# Patient Record
Sex: Male | Born: 1990 | Race: Black or African American | Hispanic: No | Marital: Single | Smoking: Current some day smoker
Health system: Southern US, Community
[De-identification: ages and names within clinical notes are randomized; demographics above are authoritative.]

---

## 2016-10-26 ENCOUNTER — Encounter (HOSPITAL_COMMUNITY): Payer: Self-pay | Admitting: *Deleted

## 2016-10-26 ENCOUNTER — Emergency Department (HOSPITAL_COMMUNITY)
Admission: EM | Admit: 2016-10-26 | Discharge: 2016-10-26 | Disposition: A | Discharge status: Home / Self Care | Payer: Self-pay | Attending: Emergency Medicine | Admitting: Emergency Medicine

## 2016-10-26 DIAGNOSIS — R112 Nausea with vomiting, unspecified: Secondary | ICD-10-CM | POA: Insufficient documentation

## 2016-10-26 DIAGNOSIS — F17200 Nicotine dependence, unspecified, uncomplicated: Secondary | ICD-10-CM | POA: Insufficient documentation

## 2016-10-26 DIAGNOSIS — R197 Diarrhea, unspecified: Secondary | ICD-10-CM | POA: Insufficient documentation

## 2016-10-26 LAB — URINALYSIS, ROUTINE W REFLEX MICROSCOPIC
BILIRUBIN URINE: NEGATIVE
Bacteria, UA: NONE SEEN
Glucose, UA: NEGATIVE mg/dL
HGB URINE DIPSTICK: NEGATIVE
Ketones, ur: 5 mg/dL — AB
NITRITE: NEGATIVE
PH: 7 (ref 5.0–8.0)
Protein, ur: 30 mg/dL — AB
SPECIFIC GRAVITY, URINE: 1.03 (ref 1.005–1.030)

## 2016-10-26 LAB — CBC
HCT: 44.5 % (ref 39.0–52.0)
Hemoglobin: 14.5 g/dL (ref 13.0–17.0)
MCH: 26.8 pg (ref 26.0–34.0)
MCHC: 32.6 g/dL (ref 30.0–36.0)
MCV: 82.3 fL (ref 78.0–100.0)
PLATELETS: 214 10*3/uL (ref 150–400)
RBC: 5.41 MIL/uL (ref 4.22–5.81)
RDW: 13.7 % (ref 11.5–15.5)
WBC: 6.8 10*3/uL (ref 4.0–10.5)

## 2016-10-26 LAB — COMPREHENSIVE METABOLIC PANEL
ALT: 122 U/L — AB (ref 17–63)
AST: 70 U/L — AB (ref 15–41)
Albumin: 3.8 g/dL (ref 3.5–5.0)
Alkaline Phosphatase: 47 U/L (ref 38–126)
Anion gap: 6 (ref 5–15)
BILIRUBIN TOTAL: 0.8 mg/dL (ref 0.3–1.2)
BUN: 12 mg/dL (ref 6–20)
CALCIUM: 9.1 mg/dL (ref 8.9–10.3)
CO2: 25 mmol/L (ref 22–32)
CREATININE: 0.97 mg/dL (ref 0.61–1.24)
Chloride: 107 mmol/L (ref 101–111)
GFR calc Af Amer: >60 mL/min (ref >60)
Glucose, Bld: 110 mg/dL — ABNORMAL HIGH (ref 65–99)
Potassium: 3.9 mmol/L (ref 3.5–5.1)
Sodium: 138 mmol/L (ref 135–145)
TOTAL PROTEIN: 7 g/dL (ref 6.5–8.1)

## 2016-10-26 LAB — LIPASE, BLOOD: Lipase: 16 U/L (ref 11–51)

## 2016-10-26 MED ORDER — FAMOTIDINE 20 MG PO TABS: 20.0000 mg | ORAL_TABLET | Freq: Two times a day (BID) | ORAL | 0 refills | Status: AC

## 2016-10-26 MED ORDER — ONDANSETRON 4 MG PO TBDP: 4.0000 mg | ORAL_TABLET | ORAL | 0 refills | Status: AC | PRN

## 2016-10-26 NOTE — ED Triage Notes (Signed)
Pt states emesis and some abdominal pain x 1 week.  Denies changes in bowel or bladder habits.

## 2016-10-26 NOTE — ED Provider Notes (Signed)
[facility]EMER[facility]ENCY DEPT Provider Note   CSN: [id number] Arrival date & time: 10/26/16  1208  By signing my name below, I, Linna DarnerRussell Turner, attest that this documentation has been prepared under the direction and in the presence of physician practitioner, Arby BarretteMarcy Rhianne Soman, MD. Electronically Signed: Linna Darnerussell Turner, Scribe. 10/26/2016. 1:13 PM.  History   Chief Complaint Chief Complaint  Patient presents with  . Emesis    The history is provided by the patient. No language interpreter was used.     HPI Comments: Casey Ellison is a 26 y.o. male who presents to the Emergency Department complaining of persistent nausea and vomiting for one week. He reports associated decreased appetite, cramping epigastric pain, occasional diarrhea, a mild cough, nasal congestion, rhinorrhea, and postnasal drip. He states he felt completely normal prior to onset of his nausea and vomiting. No alleviating factors noted. He denies dysuria, difficulty urinating, joint swelling/redness, or any other associated symptoms.  History reviewed. No pertinent past medical history.  There are no active problems to display for this patient.   History reviewed. No pertinent surgical history.     Home Medications    Prior to Admission medications   Medication Sig Start Date End Date Taking? Authorizing Provider  famotidine (PEPCID) 20 MG tablet Take 1 tablet (20 mg total) by mouth 2 (two) times daily. 10/26/16   Arby BarretteMarcy Cayli Escajeda, MD  ondansetron (ZOFRAN ODT) 4 MG disintegrating tablet Take 1 tablet (4 mg total) by mouth every 4 (four) hours as needed for nausea or vomiting. 10/26/16   Arby BarretteMarcy Parlee Amescua, MD    Family History No family history on file.  Social History Social History  Substance Use Topics  . Smoking status: Current Some Day Smoker  . Smokeless tobacco: Never Used  . Alcohol use Yes     Comment: occ     Allergies   Patient has no known allergies.   Review of Systems Review of Systems  10  Systems reviewed and all are negative for acute change except as noted in the HPI.  Physical Exam Updated Vital Signs BP 108/81 (BP Location: Left Arm)   Pulse 76   Temp 97.6 F (36.4 C) (Oral)   Resp 18   Ht 5\' 9"  (1.753 m)   Wt 140 lb (63.5 kg)   SpO2 100%   BMI 20.67 kg/m   Physical Exam  Constitutional: He is oriented to person, place, and time. He appears well-developed and well-nourished. No distress.  HENT:  Head: Normocephalic and atraumatic.  Eyes: Conjunctivae and EOM are normal.  Neck: Neck supple.  Cardiovascular: Normal rate, regular rhythm, normal heart sounds and intact distal pulses.  Exam reveals no gallop and no friction rub.   No murmur heard. Pulmonary/Chest: Effort normal and breath sounds normal. No respiratory distress. He has no wheezes. He has no rales. He exhibits no tenderness.  Abdominal: Soft. Bowel sounds are normal. He exhibits no distension and no mass. There is no tenderness.  Musculoskeletal: Normal range of motion. He exhibits no edema or tenderness.  Lower extremities: No peripheral edema. Calves are soft and non-tender.   Lymphadenopathy:    He has no cervical adenopathy.  Neurological: He is alert and oriented to person, place, and time. No cranial nerve deficit. He exhibits normal muscle tone. Coordination normal.  Skin: Skin is warm and dry.  Psychiatric: He has a normal mood and affect.  Nursing note and vitals reviewed.   ED Treatments / Results  Labs (all labs ordered are listed, but only abnormal  results are displayed) Labs Reviewed  COMPREHENSIVE METABOLIC PANEL - Abnormal; Notable for the following:       Result Value   Glucose, Bld 110 (*)    AST 70 (*)    ALT 122 (*)    All other components within normal limits  URINALYSIS, ROUTINE W REFLEX MICROSCOPIC - Abnormal; Notable for the following:    APPearance HAZY (*)    Ketones, ur 5 (*)    Protein, ur 30 (*)    Leukocytes, UA MODERATE (*)    Squamous Epithelial / LPF 0-5  (*)    All other components within normal limits  LIPASE, BLOOD  CBC    EKG  EKG Interpretation None       Radiology No results found.  Procedures Procedures (including critical care time)  DIAGNOSTIC STUDIES: Oxygen Saturation is 100% on RA, normal by my interpretation.    COORDINATION OF CARE: 1:17 PM Discussed treatment plan with pt at bedside and pt agreed to plan.  Medications Ordered in ED Medications - No data to display   Initial Impression / Assessment and Plan / ED Course  I have reviewed the triage vital signs and the nursing notes.  Pertinent labs & imaging results that were available during my care of the patient were reviewed by me and considered in my medical decision making (see chart for details).       Final Clinical Impressions(s) / ED Diagnoses   Final diagnoses:  Nausea vomiting and diarrhea  patient is clinically well appearance. Diagnostic studies are within normal limits. He is otherwise healthy. At this time findings most suggestive of a viral gastroenteritis. The patient will be treated with Pepcid and Zofran for symptoms. He is counseled on follow-up and return symptoms.  New Prescriptions New Prescriptions   FAMOTIDINE (PEPCID) 20 MG TABLET    Take 1 tablet (20 mg total) by mouth 2 (two) times daily.   ONDANSETRON (ZOFRAN ODT) 4 MG DISINTEGRATING TABLET    Take 1 tablet (4 mg total) by mouth every 4 (four) hours as needed for nausea or vomiting.      Arby Barrette, MD 10/26/16 [phone]

## 2016-10-30 ENCOUNTER — Encounter (HOSPITAL_COMMUNITY): Payer: Self-pay

## 2016-10-30 ENCOUNTER — Emergency Department (HOSPITAL_COMMUNITY)
Admission: EM | Admit: 2016-10-30 | Discharge: 2016-10-30 | Disposition: A | Discharge status: Home / Self Care | Payer: BLUE CROSS/BLUE SHIELD | Attending: Emergency Medicine | Admitting: Emergency Medicine

## 2016-10-30 ENCOUNTER — Emergency Department (HOSPITAL_COMMUNITY): Payer: BLUE CROSS/BLUE SHIELD

## 2016-10-30 DIAGNOSIS — K2960 Other gastritis without bleeding: Secondary | ICD-10-CM | POA: Diagnosis not present

## 2016-10-30 DIAGNOSIS — F17200 Nicotine dependence, unspecified, uncomplicated: Secondary | ICD-10-CM | POA: Diagnosis not present

## 2016-10-30 DIAGNOSIS — R109 Unspecified abdominal pain: Secondary | ICD-10-CM | POA: Diagnosis present

## 2016-10-30 DIAGNOSIS — K2900 Acute gastritis without bleeding: Secondary | ICD-10-CM

## 2016-10-30 LAB — URINALYSIS, ROUTINE W REFLEX MICROSCOPIC
BILIRUBIN URINE: NEGATIVE
GLUCOSE, UA: NEGATIVE mg/dL
HGB URINE DIPSTICK: NEGATIVE
Ketones, ur: 5 mg/dL — AB
Leukocytes, UA: NEGATIVE
Nitrite: NEGATIVE
PH: 5 (ref 5.0–8.0)
Protein, ur: NEGATIVE mg/dL
SPECIFIC GRAVITY, URINE: 1.03 (ref 1.005–1.030)

## 2016-10-30 LAB — COMPREHENSIVE METABOLIC PANEL
ALT: 68 U/L — ABNORMAL HIGH (ref 17–63)
AST: 41 U/L (ref 15–41)
Albumin: 4 g/dL (ref 3.5–5.0)
Alkaline Phosphatase: 43 U/L (ref 38–126)
Anion gap: 8 (ref 5–15)
BUN: 12 mg/dL (ref 6–20)
CO2: 23 mmol/L (ref 22–32)
CREATININE: 0.92 mg/dL (ref 0.61–1.24)
Calcium: 8.9 mg/dL (ref 8.9–10.3)
Chloride: 107 mmol/L (ref 101–111)
GFR calc non Af Amer: >60 mL/min (ref >60)
Glucose, Bld: 88 mg/dL (ref 65–99)
Potassium: 4 mmol/L (ref 3.5–5.1)
SODIUM: 138 mmol/L (ref 135–145)
Total Bilirubin: 0.2 mg/dL — ABNORMAL LOW (ref 0.3–1.2)
Total Protein: 6.6 g/dL (ref 6.5–8.1)

## 2016-10-30 LAB — CBC WITH DIFFERENTIAL/PLATELET
BLASTS: 0 %
Band Neutrophils: 0 %
Basophils Absolute: 0 10*3/uL (ref 0.0–0.1)
Basophils Relative: 0 %
Eosinophils Absolute: 0.4 10*3/uL (ref 0.0–0.7)
Eosinophils Relative: 6 %
HCT: 43 % (ref 39.0–52.0)
HEMOGLOBIN: 14.2 g/dL (ref 13.0–17.0)
Lymphocytes Relative: 33 %
Lymphs Abs: 2.1 10*3/uL (ref 0.7–4.0)
MCH: 26.8 pg (ref 26.0–34.0)
MCHC: 33 g/dL (ref 30.0–36.0)
MCV: 81.1 fL (ref 78.0–100.0)
MONO ABS: 1.3 10*3/uL — AB (ref 0.1–1.0)
MYELOCYTES: 0 %
Metamyelocytes Relative: 0 %
Monocytes Relative: 21 %
NEUTROS PCT: 40 %
NRBC: 0 /100{WBCs}
Neutro Abs: 2.5 10*3/uL (ref 1.7–7.7)
Other: 0 %
PROMYELOCYTES ABS: 0 %
Platelets: 207 10*3/uL (ref 150–400)
RBC: 5.3 MIL/uL (ref 4.22–5.81)
RDW: 13.4 % (ref 11.5–15.5)
WBC: 6.3 10*3/uL (ref 4.0–10.5)

## 2016-10-30 LAB — LIPASE, BLOOD: Lipase: 20 U/L (ref 11–51)

## 2016-10-30 MED ORDER — ONDANSETRON 4 MG PO TBDP
4.0000 mg | ORAL_TABLET | Freq: Once | ORAL | Status: AC
  Administered 2016-10-30: 4 mg via ORAL

## 2016-10-30 MED ORDER — ONDANSETRON 4 MG PO TBDP
ORAL_TABLET | ORAL | Status: AC
  Filled 2016-10-30: qty 1

## 2016-10-30 NOTE — Discharge Instructions (Signed)
Your blood work and ultrasound tonight are normal. Continue the medications you were given at your last visit. Call and make an appointment with the GI doctor. Go back to the bland diet and progress slowly.

## 2016-10-30 NOTE — ED Triage Notes (Signed)
Pt presents with continued generalized abdominal pain, nausea and vomiting.  Pt reports he was seen here for same last week with no relief.

## 2016-10-30 NOTE — ED Provider Notes (Signed)
[facility] Provider Note   By signing my name below, I, Casey Ellison, attest that this documentation has been prepared under the direction and in the presence of [facility], [place]. Electronically Signed: [name], ED Scribe. 10/30/16. 7:53 PM.   History   Chief Complaint Chief Complaint  Patient presents with  . Abdominal Pain   The history is provided by the patient and medical records. No language interpreter was used.   HPI Comments: Casey Ellison is a 26 y.o. male who presents to the Emergency Department complaining of intermittent abdominal pain that began about 1.5 weeks ago. He reports associated nausea and vomiting. Pt was seen here four days ago for the same symptoms, told he had a stomach virus and was prescribed Pepcid and Zofran. He has been taking the Pepcid which provided some relief temporarily for the first few days. Eating increases the symptoms and pt reports vomiting after eating [employer] earlier today. He states this is the first time he has vomited since he was seen four days ago. He states he has eaten chicken and rice, applesauce and pizza without issue in the last few days. He denies alleviating factors. He denies fever, chills, diarrhea, cough, sore throat, dysuria, frequency.   History reviewed. No pertinent past medical history.  There are no active problems to display for this patient.  History reviewed. No pertinent surgical history.  Home Medications    Prior to Admission medications   Medication Sig Start Date End Date Taking? Authorizing Provider  famotidine (PEPCID) 20 MG tablet Take 1 tablet (20 mg total) by mouth 2 (two) times daily. 10/26/16   Arby Barrette, MD  ondansetron (ZOFRAN ODT) 4 MG disintegrating tablet Take 1 tablet (4 mg total) by mouth every 4 (four) hours as needed for nausea or vomiting. 10/26/16   Arby Barrette, MD    Family History History reviewed. No pertinent family history.  Social  History Social History  Substance Use Topics  . Smoking status: Current Some Day Smoker  . Smokeless tobacco: Never Used  . Alcohol use Yes     Comment: occ     Allergies   Patient has no known allergies.   Review of Systems Review of Systems  Constitutional: Negative for chills and fever.  HENT: Negative for sore throat.   Respiratory: Negative for cough.   Gastrointestinal: Positive for abdominal pain, nausea and vomiting. Negative for diarrhea.  Genitourinary: Negative for dysuria and frequency.  Musculoskeletal: Negative for back pain.  Skin: Negative for rash.  Neurological: Negative for headaches.  Psychiatric/Behavioral: Negative for confusion.    Physical Exam Updated Vital Signs BP 113/66 (BP Location: Right Arm)   Pulse 89   Temp 98.1 F (36.7 C) (Oral)   Resp (!) 22   Ht 5\' 11"  (1.803 m)   Wt 140 lb (63.5 kg)   SpO2 99%   BMI 19.53 kg/m   Physical Exam  Constitutional: He appears well-developed and well-nourished.  Eyes: EOM are normal.  Neck: Neck supple.  Cardiovascular: Normal rate and regular rhythm.   Pulmonary/Chest: Effort normal and breath sounds normal.  Abdominal: Soft. Bowel sounds are normal. There is no tenderness. There is no CVA tenderness.  Non tender with deep palpation I am unable to reproduce the pain that the patient had earlier.   Musculoskeletal: Normal range of motion.  Neurological: He is alert.  Skin: Skin is warm and dry.  Psychiatric: He has a normal mood and affect. His behavior is normal.  Nursing note  and vitals reviewed.   ED Treatments / Results  DIAGNOSTIC STUDIES: Oxygen Saturation is 99% on RA, normal by my interpretation.   COORDINATION OF CARE: 5:39 PM- Will speak with Dr. Rush Landmarkegeler about appropriate plan of treatment. Pt verbalizes understanding and agrees to plan.  5:41 PM- Will order ultrasound of gall bladder.   Medications  ondansetron (ZOFRAN-ODT) 4 MG disintegrating tablet (not administered)   ondansetron (ZOFRAN-ODT) disintegrating tablet 4 mg (4 mg Oral Given 10/30/16 1520)   Labs (all labs ordered are listed, but only abnormal results are displayed) Labs Reviewed  CBC WITH DIFFERENTIAL/PLATELET - Abnormal; Notable for the following:       Result Value   Monocytes Absolute 1.3 (*)    All other components within normal limits  COMPREHENSIVE METABOLIC PANEL - Abnormal; Notable for the following:    ALT 68 (*)    Total Bilirubin 0.2 (*)    All other components within normal limits  URINALYSIS, ROUTINE W REFLEX MICROSCOPIC - Abnormal; Notable for the following:    Ketones, ur 5 (*)    All other components within normal limits  LIPASE, BLOOD   Radiology [place] Abdomen Limited  Result Date: 10/30/2016 CLINICAL DATA:  Abdominal pain with nausea and vomiting for 2 weeks. EXAM: US ABDOMEN LIMITED - RIGHT UPPER QUADRANT COMPARISON:  None. FINDINGS: Gallbladder: No gallstones or gallbladder wall thickening. No pericholecystic fluid. The sonographer reports no sonographic Murphy's sign. Common bile duct: Diameter: 2 mm Liver: No focal lesion identified. Within normal limits in parenchymal echogenicity. IMPRESSION: Normal limited right upper quadrant ultrasound. Electronically Signed   By: Kennith CenterEric  Mansell M.D.   On: 10/30/2016 19:21    Procedures Procedures (including critical care time)  Medications Ordered in ED Medications  ondansetron (ZOFRAN-ODT) 4 MG disintegrating tablet (not administered)  ondansetron (ZOFRAN-ODT) disintegrating tablet 4 mg (4 mg Oral Given 10/30/16 1520)     Initial Impression / Assessment and Plan / ED Course  I have reviewed the triage vital signs and the nursing notes.  Pertinent labs & imaging results that were available during my care of the patient were reviewed by me and considered in my medical decision making (see chart for details).  Patient with symptoms consistent with gastritis.  Vitals are stable, no fever.  No signs of dehydration, tolerating  PO fluids > 6 oz.  Lungs are clear.  Ultrasound negative. No focal abdominal pain, no concern for appendicitis, cholecystitis, pancreatitis, ruptured viscus, UTI, kidney stone, or any other abdominal etiology. Supportive therapy indicated with return if symptoms worsen. Patient counseled. Patient will continue his medications from last visit and follow up with GI. Return precautions.   I personally performed the services described in this documentation, which was scribed in my presence. The recorded information has been reviewed and is accurate.   Final Clinical Impressions(s) / ED Diagnoses   Final diagnoses:  Other acute gastritis without hemorrhage    New Prescriptions New Prescriptions   No medications on file      [facility] Neese, NP 10/30/16 [id number]    Canary Brimhristopher J Tegeler, MD 10/31/16 [phone]

## 2018-09-04 IMAGING — US US ABDOMEN LIMITED
1 series · 14 of 25 positions shown · non-contrast
Comparison: None.

CLINICAL DATA: Abdominal pain with nausea and vomiting for 2 weeks.

EXAM:
US ABDOMEN LIMITED - RIGHT UPPER QUADRANT

[Series 1: us abdomen limited · 0.19mm/px · 14 of 46 slices shown]
[im 1/46]
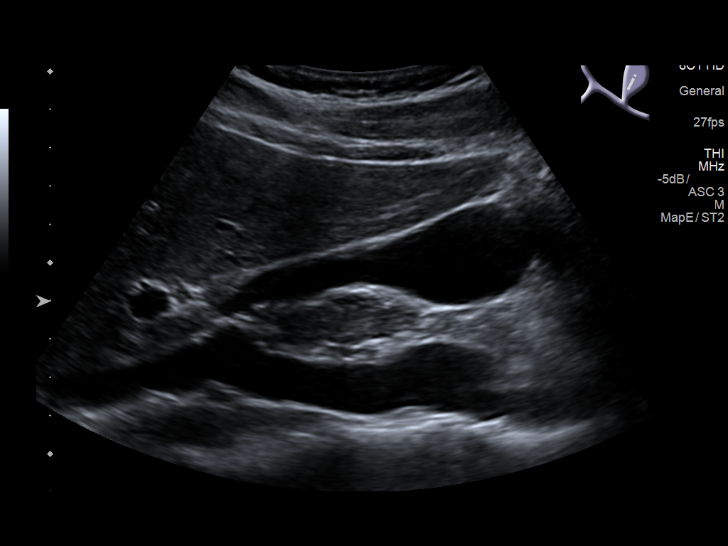
[im 4/46]
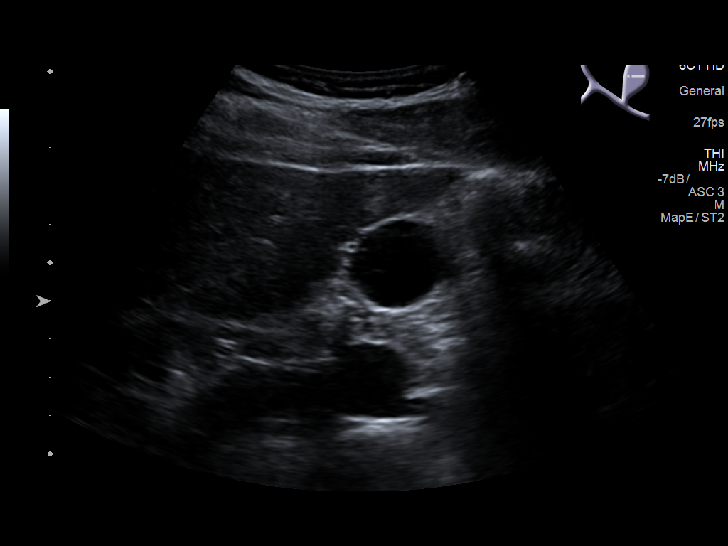
[im 8/46]
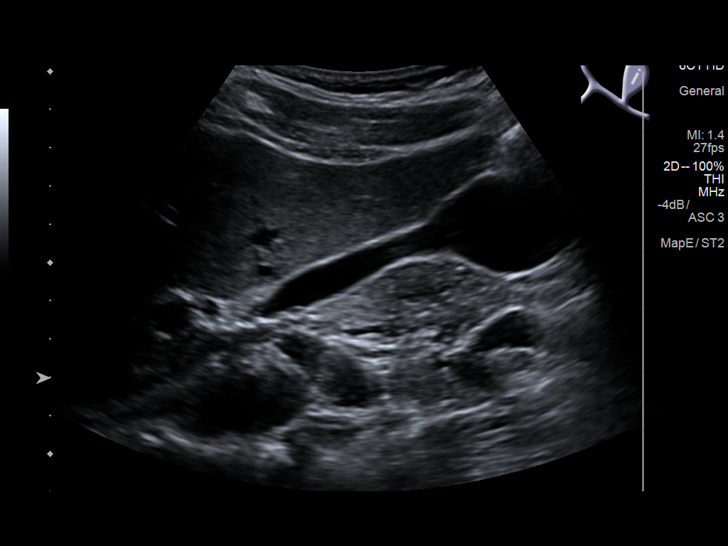
[im 12/46]
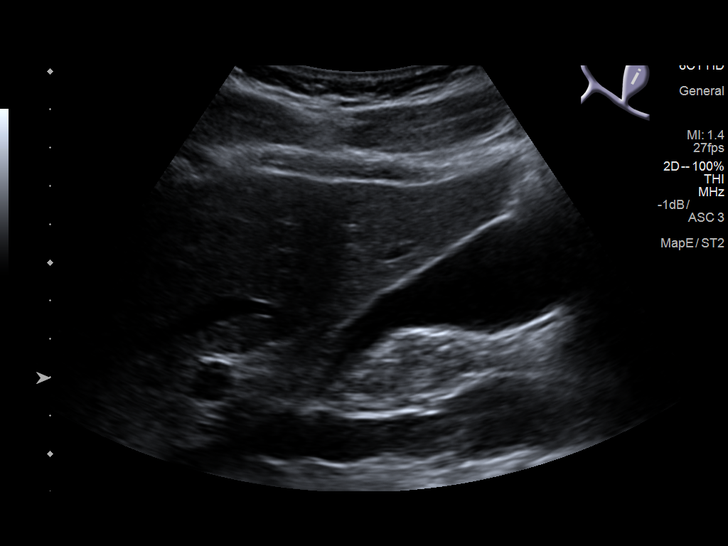
[im 16/46]
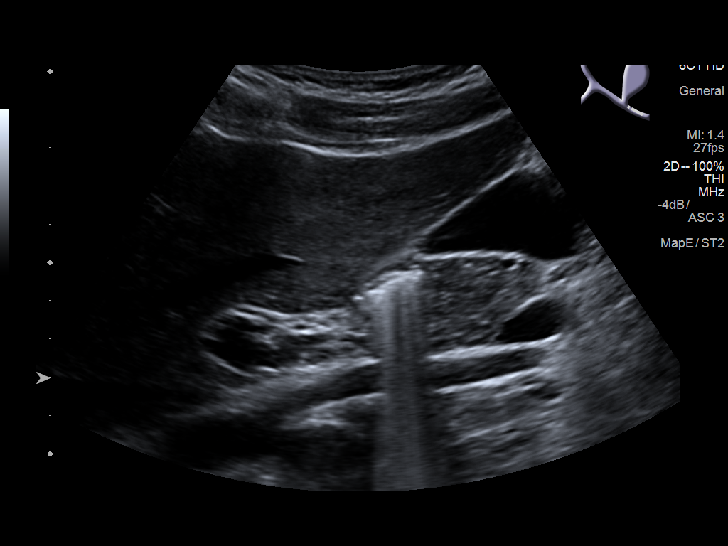
[im 17/46]
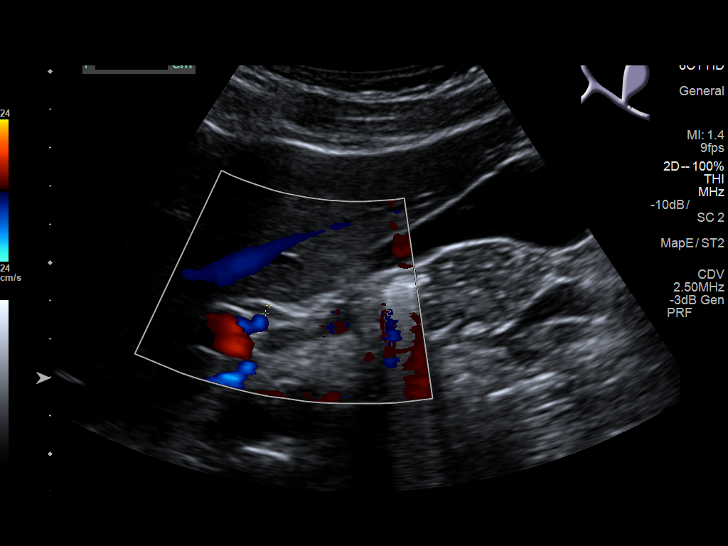
[im 21/46]
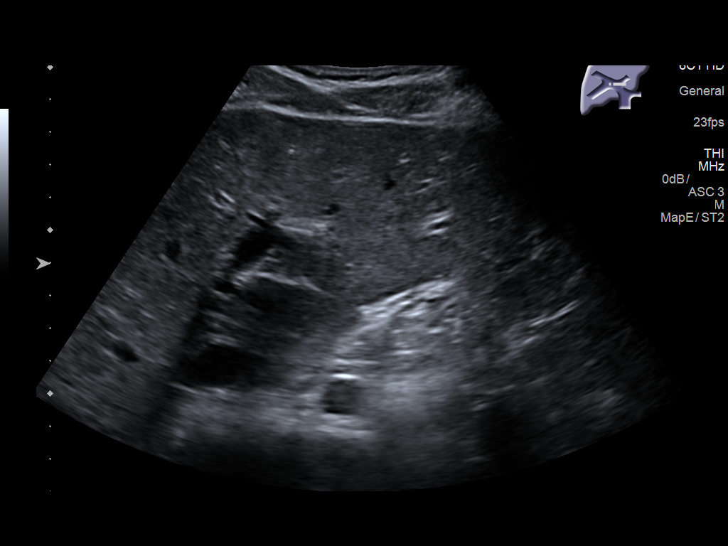
[im 25/46]
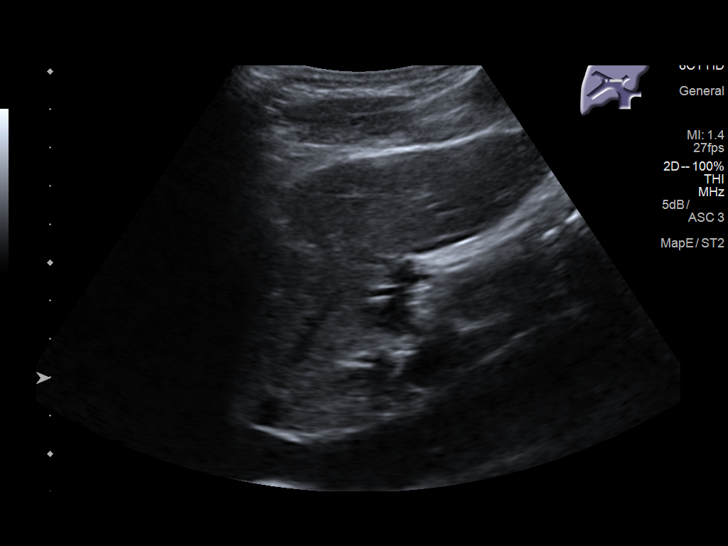
[im 29/46]
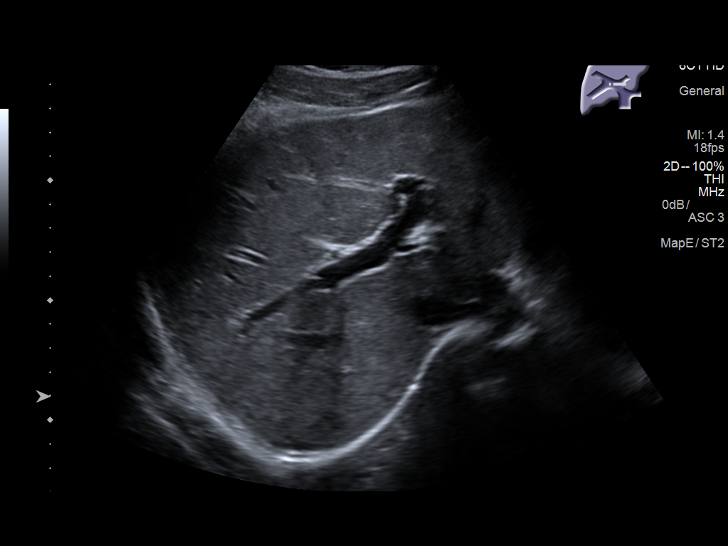
[im 31/46]
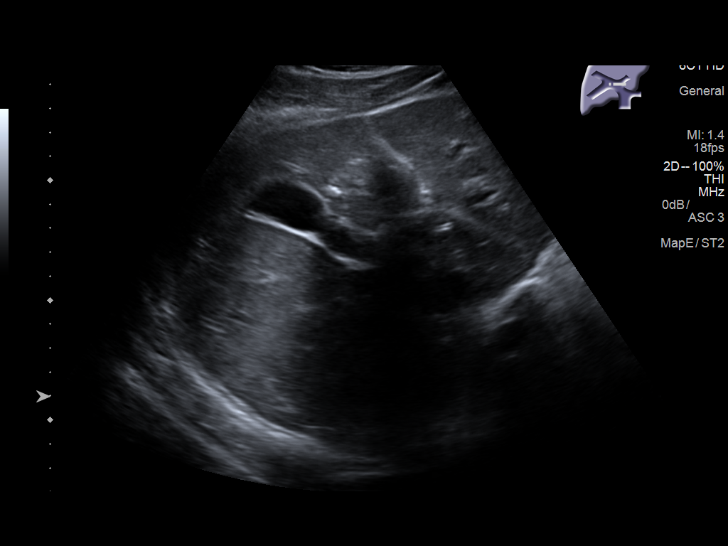
[im 34/46]
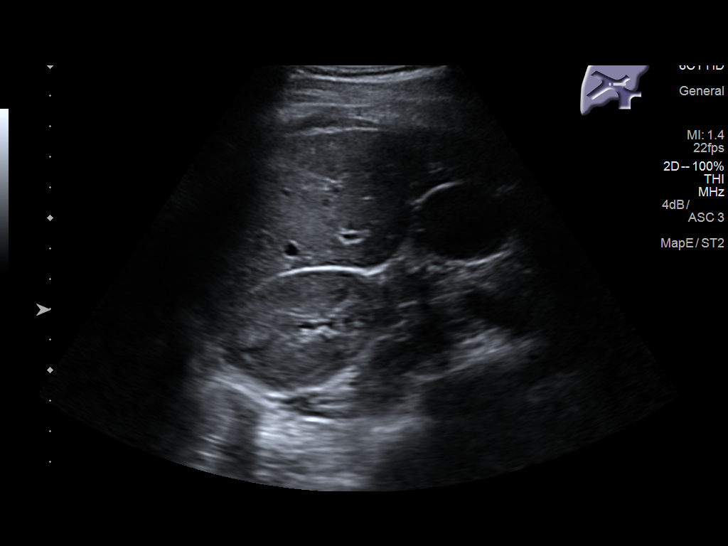
[im 38/46]
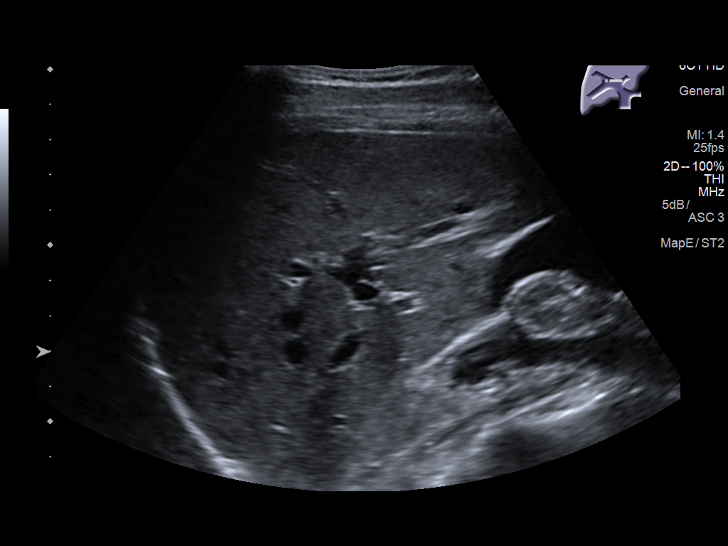
[im 42/46]
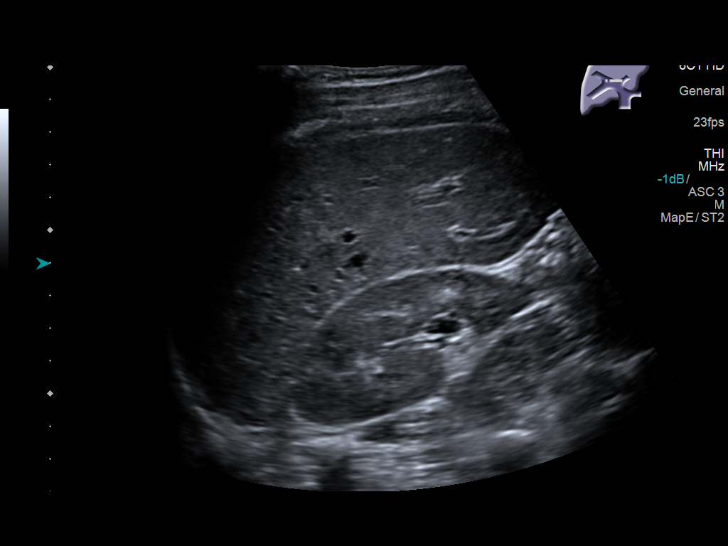
[im 46/46]
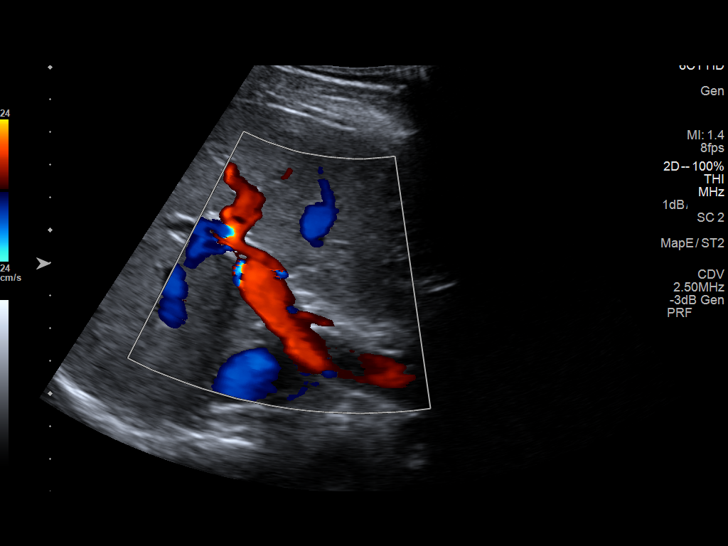

[14 of 25 positions shown; findings below may reference images not displayed]

FINDINGS: Gallbladder:

No gallstones or gallbladder wall thickening. No pericholecystic
fluid. The sonographer reports no sonographic Murphy's sign.

Common bile duct:

Diameter: 2 mm

Liver:

No focal lesion identified. Within normal limits in parenchymal
echogenicity.
IMPRESSION: Normal limited right upper quadrant ultrasound.
